# Patient Record
Sex: Male | Born: 1960 | Race: Black or African American | Hispanic: No | Smoking: Never smoker
Health system: Southern US, Community
[De-identification: ages and names within clinical notes are randomized; demographics above are authoritative.]

## PROBLEM LIST (undated history)

## (undated) ENCOUNTER — Emergency Department: Admission: EM | Payer: 59

## (undated) DIAGNOSIS — I10 Essential (primary) hypertension: Secondary | ICD-10-CM

## (undated) DIAGNOSIS — J45909 Unspecified asthma, uncomplicated: Secondary | ICD-10-CM

## (undated) DIAGNOSIS — E669 Obesity, unspecified: Secondary | ICD-10-CM

## (undated) DIAGNOSIS — E785 Hyperlipidemia, unspecified: Secondary | ICD-10-CM

## (undated) DIAGNOSIS — M109 Gout, unspecified: Secondary | ICD-10-CM

## (undated) DIAGNOSIS — G4730 Sleep apnea, unspecified: Secondary | ICD-10-CM

## (undated) HISTORY — DX: Essential (primary) hypertension: I10

## (undated) HISTORY — DX: Unspecified asthma, uncomplicated: J45.909

## (undated) HISTORY — PX: APPENDECTOMY: SHX54

---

## 2000-07-06 ENCOUNTER — Other Ambulatory Visit: Admission: RE | Admit: 2000-07-06 | Discharge: 2000-07-06 | Payer: Self-pay | Admitting: Otolaryngology

## 2002-06-16 ENCOUNTER — Ambulatory Visit (HOSPITAL_BASED_OUTPATIENT_CLINIC_OR_DEPARTMENT_OTHER): Admission: RE | Admit: 2002-06-16 | Discharge: 2002-06-16 | Payer: Self-pay | Admitting: Otolaryngology

## 2002-06-16 ENCOUNTER — Ambulatory Visit: Admission: RE | Admit: 2002-06-16 | Discharge: 2002-07-17 | Payer: Self-pay | Admitting: Radiation Oncology

## 2003-10-27 ENCOUNTER — Ambulatory Visit: Admission: RE | Admit: 2003-10-27 | Discharge: 2003-11-13 | Payer: Self-pay | Admitting: Radiation Oncology

## 2009-08-20 ENCOUNTER — Ambulatory Visit: Payer: Self-pay | Admitting: Gastroenterology

## 2010-06-12 ENCOUNTER — Ambulatory Visit: Payer: Self-pay | Admitting: Internal Medicine

## 2010-07-08 ENCOUNTER — Ambulatory Visit: Payer: Self-pay | Admitting: Internal Medicine

## 2012-10-04 ENCOUNTER — Other Ambulatory Visit: Payer: Self-pay | Admitting: Podiatry

## 2012-10-04 LAB — BODY FLUID CELL COUNT WITH DIFFERENTIAL
Basophil: 0 %
Eosinophil: 0 %
Lymphocytes: 1 %
Neutrophils: 68 %
Nucleated Cell Count: 1824 /mm3
Other Cells BF: 0 %
Other Mononuclear Cells: 31 %

## 2012-10-04 LAB — SYNOVIAL FLUID, CRYSTAL: Crystals, Joint Fluid: NONE SEEN

## 2013-06-21 ENCOUNTER — Ambulatory Visit: Payer: Self-pay | Admitting: Internal Medicine

## 2014-12-24 ENCOUNTER — Encounter: Payer: Self-pay | Admitting: *Deleted

## 2014-12-25 ENCOUNTER — Encounter
Admission: RE | Disposition: A | Discharge status: Home / Self Care | Payer: Self-pay | Source: Ambulatory Visit | Attending: Gastroenterology

## 2014-12-25 ENCOUNTER — Encounter: Payer: Self-pay | Admitting: *Deleted

## 2014-12-25 ENCOUNTER — Ambulatory Visit
Admission: RE | Admit: 2014-12-25 | Discharge: 2014-12-25 | Disposition: A | Discharge status: Home / Self Care | Payer: 59 | Source: Ambulatory Visit | Attending: Gastroenterology | Admitting: Gastroenterology

## 2014-12-25 ENCOUNTER — Ambulatory Visit: Payer: 59 | Admitting: Anesthesiology

## 2014-12-25 DIAGNOSIS — E785 Hyperlipidemia, unspecified: Secondary | ICD-10-CM | POA: Diagnosis not present

## 2014-12-25 DIAGNOSIS — K5730 Diverticulosis of large intestine without perforation or abscess without bleeding: Secondary | ICD-10-CM | POA: Diagnosis not present

## 2014-12-25 DIAGNOSIS — Z6830 Body mass index (BMI) 30.0-30.9, adult: Secondary | ICD-10-CM | POA: Diagnosis not present

## 2014-12-25 DIAGNOSIS — D124 Benign neoplasm of descending colon: Secondary | ICD-10-CM | POA: Insufficient documentation

## 2014-12-25 DIAGNOSIS — G4730 Sleep apnea, unspecified: Secondary | ICD-10-CM | POA: Diagnosis not present

## 2014-12-25 DIAGNOSIS — E669 Obesity, unspecified: Secondary | ICD-10-CM | POA: Diagnosis not present

## 2014-12-25 DIAGNOSIS — M109 Gout, unspecified: Secondary | ICD-10-CM | POA: Insufficient documentation

## 2014-12-25 DIAGNOSIS — Z86010 Personal history of colonic polyps: Secondary | ICD-10-CM | POA: Diagnosis present

## 2014-12-25 HISTORY — DX: Hyperlipidemia, unspecified: E78.5

## 2014-12-25 HISTORY — DX: Sleep apnea, unspecified: G47.30

## 2014-12-25 HISTORY — DX: Gout, unspecified: M10.9

## 2014-12-25 HISTORY — DX: Obesity, unspecified: E66.9

## 2014-12-25 HISTORY — PX: COLONOSCOPY WITH PROPOFOL: SHX5780

## 2014-12-25 SURGERY — COLONOSCOPY WITH PROPOFOL
Anesthesia: General

## 2014-12-25 MED ORDER — SODIUM CHLORIDE 0.9 % IV SOLN
INTRAVENOUS | Status: DC
  Administered 2014-12-25: 15:00:00 via INTRAVENOUS

## 2014-12-25 MED ORDER — LIDOCAINE HCL (CARDIAC) 20 MG/ML IV SOLN
INTRAVENOUS | Status: DC | PRN
  Administered 2014-12-25: 60 mg via INTRAVENOUS

## 2014-12-25 MED ORDER — PROPOFOL 10 MG/ML IV BOLUS
INTRAVENOUS | Status: DC | PRN
  Administered 2014-12-25: 40 mg via INTRAVENOUS

## 2014-12-25 MED ORDER — PROPOFOL INFUSION 10 MG/ML OPTIME
INTRAVENOUS | Status: DC | PRN
  Administered 2014-12-25: 120 ug/kg/min via INTRAVENOUS

## 2014-12-25 MED ORDER — MIDAZOLAM HCL 2 MG/2ML IJ SOLN
INTRAMUSCULAR | Status: DC | PRN
  Administered 2014-12-25 (×2): 1 mg via INTRAVENOUS

## 2014-12-25 NOTE — Anesthesia Preprocedure Evaluation (Signed)
Anesthesia Evaluation  Patient identified by MRN, date of birth, ID band Patient awake    Reviewed: Allergy & Precautions, NPO status , Patient's Chart, lab work & pertinent test results, reviewed documented beta blocker date and time   Airway Mallampati: III  TM Distance: >3 FB     Dental  (+) Chipped   Pulmonary sleep apnea ,          Cardiovascular     Neuro/Psych    GI/Hepatic   Endo/Other    Renal/GU      Musculoskeletal   Abdominal   Peds  Hematology   Anesthesia Other Findings Does not use CPAP. He will need to be watched tonight. Advised him to sleep sitting up if possible.  Reproductive/Obstetrics                             Anesthesia Physical Anesthesia Plan  ASA: III  Anesthesia Plan: General   Post-op Pain Management:    Induction: Intravenous  Airway Management Planned: Nasal Cannula  Additional Equipment:   Intra-op Plan:   Post-operative Plan:   Informed Consent: I have reviewed the patients History and Physical, chart, labs and discussed the procedure including the risks, benefits and alternatives for the proposed anesthesia with the patient or authorized representative who has indicated his/her understanding and acceptance.     Plan Discussed with: CRNA  Anesthesia Plan Comments:         Anesthesia Quick Evaluation

## 2014-12-25 NOTE — Anesthesia Postprocedure Evaluation (Signed)
  Anesthesia Post-op Note  Patient: Brandon Nichols  Procedure(s) Performed: Procedure(s): COLONOSCOPY WITH PROPOFOL (N/A)  Anesthesia type:General  Patient location: PACU  Post pain: Pain level controlled  Post assessment: Post-op Vital signs reviewed, Patient's Cardiovascular Status Stable, Respiratory Function Stable, Patent Airway and No signs of Nausea or vomiting  Post vital signs: Reviewed and stable  Last Vitals:  Filed Vitals:   12/25/14 1710  BP: 114/85  Pulse: 54  Temp:   Resp: 12    Level of consciousness: awake, alert  and patient cooperative  Complications: No apparent anesthesia complications

## 2014-12-25 NOTE — Transfer of Care (Signed)
Immediate Anesthesia Transfer of Care Note  Patient: Brandon Nichols  Procedure(s) Performed: Procedure(s): COLONOSCOPY WITH PROPOFOL (N/A)  Patient Location: Endoscopy Unit  Anesthesia Type:General  Level of Consciousness: awake, alert , oriented and patient cooperative  Airway & Oxygen Therapy: Patient Spontanous Breathing and Patient connected to nasal cannula oxygen  Post-op Assessment: Report given to RN, Post -op Vital signs reviewed and stable and Patient moving all extremities X 4  Post vital signs: Reviewed and stable  Last Vitals:  Filed Vitals:   12/25/14 1640  BP: 94/62  Pulse: 63  Temp: 36.3 C  Resp: 10    Complications: No apparent anesthesia complications

## 2014-12-25 NOTE — Op Note (Signed)
[facility] Gastroenterology Patient Name: Brandon Nichols Procedure Date: 12/25/2014 3:59 PM MRN: [id number] Account #: [id number] Date of Birth: 26-May-1961 Admit Type: Outpatient Age: 54 Room: [facility] ENDO ROOM 1 Gender: Male Note Status: Finalized Procedure:         Colonoscopy Indications:       Personal history of colonic polyps Providers:         Lollie Sails, MD Referring MD:      Tracie Harrier, MD (Referring MD) Medicines:         Monitored Anesthesia Care Complications:     No immediate complications. Procedure:         Pre-Anesthesia Assessment:                    - ASA Grade Assessment: III - A patient with severe                     systemic disease.                    After obtaining informed consent, the colonoscope was                     passed under direct vision. Throughout the procedure, the                     patient's blood pressure, pulse, and oxygen saturations                     were monitored continuously. The Olympus PCF-H180AL                     colonoscope ( S#: [id number] ) was introduced through the                     anus and advanced to the the cecum, identified by                     appendiceal orifice and ileocecal valve. The colonoscopy                     was performed without difficulty. The patient tolerated                     the procedure well. The quality of the bowel preparation                     was fair. Findings:      A 2 mm polyp was found in the descending colon. The polyp was sessile.       The polyp was removed with a cold biopsy forceps. Resection and       retrieval were complete.      A few small-mouthed diverticula were found in the sigmoid colon.      The retroflexed view of the distal rectum and anal verge was normal and       showed no anal or rectal abnormalities.      The digital rectal exam was normal. Impression:        - One 2 mm polyp in the descending colon. Resected and     retrieved.                    - Diverticulosis in the sigmoid colon.                    -  The distal rectum and anal verge are normal on                     retroflexion view. Recommendation:    - Await pathology results.                    - Telephone GI clinic for pathology results in 1 week. Procedure Code(s): --- Professional ---                    [id number], Colonoscopy, flexible; with biopsy, single or                     multiple Diagnosis Code(s): --- Professional ---                    211.3, Benign neoplasm of colon                    V12.72, Personal history of colonic polyps                    562.10, Diverticulosis of colon (without mention of                     hemorrhage) CPT copyright 2014 American Medical Association. All rights reserved. The codes documented in this report are preliminary and upon coder review may  be revised to meet current compliance requirements. Lollie Sails, MD 12/25/2014 4:39:15 PM This report has been signed electronically. Number of Addenda: 0 Note Initiated On: 12/25/2014 3:59 PM Scope Withdrawal Time: 0 hours 10 minutes 59 seconds  Total Procedure Duration: 0 hours 19 minutes 48 seconds       [facility]

## 2014-12-25 NOTE — H&P (Signed)
Outpatient short stay form Pre-procedure 12/25/2014 3:59 PM Lollie Sails MD  Primary Physician: Dr. Roetta Sessions  Reason for visit:  Colonoscopy  History of present illness:  Personal history of adenomatous colon polyps. Patient is a 54 year old male seen for follow-up in regards to his history of colon polyps. Colonoscopy was in 2011. There were several polyps removed at that time consistent with adenomas.  He is not taking anticoagulates or aspirin products. He tolerated his prep well.    Current facility-administered medications:  .  0.9 %  sodium chloride infusion, , Intravenous, Continuous, Lollie Sails, MD, Last Rate: 20 mL/hr at 12/25/14 1513  No prescriptions prior to admission     Not on File   Past Medical History  Diagnosis Date  . Hyperlipemia   . Obesity   . Sleep apnea   . Gout     Review of systems:      Physical Exam    Heart and lungs: Regular rate and rhythm without rub or gallop lungs bilaterally clear    HEENT: Normocephalic atraumatic eyes are anicteric    Other:     Pertinant exam for procedure: Soft nontender nondistended bowel sounds positive normoactive    Planned proceedures: Colonoscopy and indicated procedures I have discussed the risks benefits and complications of procedures to include not limited to bleeding, infection, perforation and the risk of sedation and the patient wishes to proceed.    Lollie Sails, MD Gastroenterology 12/25/2014  3:59 PM

## 2014-12-26 NOTE — Progress Notes (Signed)
Non-identifying Voicemail.  No message left. 

## 2014-12-29 ENCOUNTER — Encounter: Payer: Self-pay | Admitting: Gastroenterology

## 2014-12-30 LAB — SURGICAL PATHOLOGY

## 2015-06-15 ENCOUNTER — Other Ambulatory Visit: Payer: Self-pay | Admitting: Internal Medicine

## 2015-06-15 ENCOUNTER — Ambulatory Visit
Admission: RE | Admit: 2015-06-15 | Discharge: 2015-06-15 | Disposition: A | Discharge status: Home / Self Care | Payer: 59 | Source: Ambulatory Visit | Attending: Internal Medicine | Admitting: Internal Medicine

## 2015-06-15 DIAGNOSIS — R1032 Left lower quadrant pain: Secondary | ICD-10-CM

## 2015-06-15 DIAGNOSIS — R634 Abnormal weight loss: Secondary | ICD-10-CM

## 2015-06-15 DIAGNOSIS — I700 Atherosclerosis of aorta: Secondary | ICD-10-CM | POA: Diagnosis not present

## 2015-06-15 MED ORDER — IOHEXOL 300 MG/ML  SOLN
100.0000 mL | Freq: Once | INTRAMUSCULAR | Status: AC | PRN
  Administered 2015-06-15: 100 mL via INTRAVENOUS

## 2016-07-10 DIAGNOSIS — L905 Scar conditions and fibrosis of skin: Secondary | ICD-10-CM | POA: Diagnosis not present

## 2016-07-10 DIAGNOSIS — Z0000 Encounter for general adult medical examination without abnormal findings: Secondary | ICD-10-CM | POA: Diagnosis not present

## 2016-07-10 DIAGNOSIS — Z8619 Personal history of other infectious and parasitic diseases: Secondary | ICD-10-CM | POA: Diagnosis not present

## 2016-10-13 DIAGNOSIS — M109 Gout, unspecified: Secondary | ICD-10-CM | POA: Diagnosis not present

## 2016-11-01 DIAGNOSIS — L819 Disorder of pigmentation, unspecified: Secondary | ICD-10-CM | POA: Diagnosis not present

## 2016-11-01 DIAGNOSIS — L905 Scar conditions and fibrosis of skin: Secondary | ICD-10-CM | POA: Diagnosis not present

## 2016-11-01 DIAGNOSIS — Z872 Personal history of diseases of the skin and subcutaneous tissue: Secondary | ICD-10-CM | POA: Diagnosis not present

## 2017-01-01 DIAGNOSIS — L905 Scar conditions and fibrosis of skin: Secondary | ICD-10-CM | POA: Diagnosis not present

## 2017-01-01 DIAGNOSIS — Z0000 Encounter for general adult medical examination without abnormal findings: Secondary | ICD-10-CM | POA: Diagnosis not present

## 2017-01-01 DIAGNOSIS — Z8619 Personal history of other infectious and parasitic diseases: Secondary | ICD-10-CM | POA: Diagnosis not present

## 2017-01-08 DIAGNOSIS — L989 Disorder of the skin and subcutaneous tissue, unspecified: Secondary | ICD-10-CM | POA: Diagnosis not present

## 2017-01-08 DIAGNOSIS — J302 Other seasonal allergic rhinitis: Secondary | ICD-10-CM | POA: Diagnosis not present

## 2017-01-08 DIAGNOSIS — E782 Mixed hyperlipidemia: Secondary | ICD-10-CM | POA: Diagnosis not present

## 2017-02-22 DIAGNOSIS — J302 Other seasonal allergic rhinitis: Secondary | ICD-10-CM | POA: Diagnosis not present

## 2017-02-22 DIAGNOSIS — R51 Headache: Secondary | ICD-10-CM | POA: Diagnosis not present

## 2017-02-23 ENCOUNTER — Other Ambulatory Visit: Payer: Self-pay | Admitting: Internal Medicine

## 2017-02-23 DIAGNOSIS — R519 Headache, unspecified: Secondary | ICD-10-CM

## 2017-02-23 DIAGNOSIS — R51 Headache: Principal | ICD-10-CM

## 2017-03-02 ENCOUNTER — Other Ambulatory Visit: Payer: Self-pay | Admitting: Internal Medicine

## 2017-03-02 ENCOUNTER — Ambulatory Visit
Admission: RE | Admit: 2017-03-02 | Discharge: 2017-03-02 | Disposition: A | Discharge status: Home / Self Care | Payer: 59 | Source: Ambulatory Visit | Attending: Internal Medicine | Admitting: Internal Medicine

## 2017-03-02 DIAGNOSIS — R519 Headache, unspecified: Secondary | ICD-10-CM

## 2017-03-02 DIAGNOSIS — R51 Headache: Secondary | ICD-10-CM | POA: Diagnosis not present

## 2017-03-02 MED ORDER — GADOBENATE DIMEGLUMINE 529 MG/ML IV SOLN: 20.0000 mL | Freq: Once | INTRAVENOUS | Status: DC | PRN

## 2017-03-07 DIAGNOSIS — L819 Disorder of pigmentation, unspecified: Secondary | ICD-10-CM | POA: Diagnosis not present

## 2017-03-07 DIAGNOSIS — L910 Hypertrophic scar: Secondary | ICD-10-CM | POA: Diagnosis not present

## 2017-03-09 DIAGNOSIS — M1000 Idiopathic gout, unspecified site: Secondary | ICD-10-CM | POA: Diagnosis not present

## 2017-04-13 DIAGNOSIS — G4733 Obstructive sleep apnea (adult) (pediatric): Secondary | ICD-10-CM | POA: Diagnosis not present

## 2017-04-13 DIAGNOSIS — M79672 Pain in left foot: Secondary | ICD-10-CM | POA: Diagnosis not present

## 2017-04-13 DIAGNOSIS — E782 Mixed hyperlipidemia: Secondary | ICD-10-CM | POA: Diagnosis not present

## 2017-04-25 DIAGNOSIS — L910 Hypertrophic scar: Secondary | ICD-10-CM | POA: Diagnosis not present

## 2017-06-20 DIAGNOSIS — L910 Hypertrophic scar: Secondary | ICD-10-CM | POA: Diagnosis not present

## 2017-07-27 DIAGNOSIS — E782 Mixed hyperlipidemia: Secondary | ICD-10-CM | POA: Diagnosis not present

## 2017-07-27 DIAGNOSIS — M79672 Pain in left foot: Secondary | ICD-10-CM | POA: Diagnosis not present

## 2017-07-27 DIAGNOSIS — Z8739 Personal history of other diseases of the musculoskeletal system and connective tissue: Secondary | ICD-10-CM | POA: Diagnosis not present

## 2017-07-27 DIAGNOSIS — L989 Disorder of the skin and subcutaneous tissue, unspecified: Secondary | ICD-10-CM | POA: Diagnosis not present

## 2017-07-27 DIAGNOSIS — J302 Other seasonal allergic rhinitis: Secondary | ICD-10-CM | POA: Diagnosis not present

## 2017-08-17 DIAGNOSIS — Z0000 Encounter for general adult medical examination without abnormal findings: Secondary | ICD-10-CM | POA: Diagnosis not present

## 2017-08-17 DIAGNOSIS — Z8739 Personal history of other diseases of the musculoskeletal system and connective tissue: Secondary | ICD-10-CM | POA: Diagnosis not present

## 2017-08-17 DIAGNOSIS — K581 Irritable bowel syndrome with constipation: Secondary | ICD-10-CM | POA: Diagnosis not present

## 2017-08-22 DIAGNOSIS — L720 Epidermal cyst: Secondary | ICD-10-CM | POA: Diagnosis not present

## 2017-08-22 DIAGNOSIS — L910 Hypertrophic scar: Secondary | ICD-10-CM | POA: Diagnosis not present

## 2017-10-03 DIAGNOSIS — L910 Hypertrophic scar: Secondary | ICD-10-CM | POA: Diagnosis not present

## 2018-02-11 DIAGNOSIS — Z8739 Personal history of other diseases of the musculoskeletal system and connective tissue: Secondary | ICD-10-CM | POA: Diagnosis not present

## 2018-02-11 DIAGNOSIS — J302 Other seasonal allergic rhinitis: Secondary | ICD-10-CM | POA: Diagnosis not present

## 2018-02-11 DIAGNOSIS — K581 Irritable bowel syndrome with constipation: Secondary | ICD-10-CM | POA: Diagnosis not present

## 2018-02-19 DIAGNOSIS — R030 Elevated blood-pressure reading, without diagnosis of hypertension: Secondary | ICD-10-CM | POA: Diagnosis not present

## 2018-02-19 DIAGNOSIS — R0789 Other chest pain: Secondary | ICD-10-CM | POA: Diagnosis not present

## 2018-02-19 DIAGNOSIS — E785 Hyperlipidemia, unspecified: Secondary | ICD-10-CM | POA: Diagnosis not present

## 2018-03-11 DIAGNOSIS — E785 Hyperlipidemia, unspecified: Secondary | ICD-10-CM | POA: Diagnosis not present

## 2018-03-11 DIAGNOSIS — G4733 Obstructive sleep apnea (adult) (pediatric): Secondary | ICD-10-CM | POA: Diagnosis not present

## 2018-03-11 DIAGNOSIS — R079 Chest pain, unspecified: Secondary | ICD-10-CM | POA: Diagnosis not present

## 2018-04-03 DIAGNOSIS — G4733 Obstructive sleep apnea (adult) (pediatric): Secondary | ICD-10-CM | POA: Diagnosis not present

## 2018-04-03 DIAGNOSIS — E785 Hyperlipidemia, unspecified: Secondary | ICD-10-CM | POA: Diagnosis not present

## 2018-04-03 DIAGNOSIS — R079 Chest pain, unspecified: Secondary | ICD-10-CM | POA: Diagnosis not present

## 2018-04-11 DIAGNOSIS — R0789 Other chest pain: Secondary | ICD-10-CM | POA: Diagnosis not present

## 2018-04-11 DIAGNOSIS — G4733 Obstructive sleep apnea (adult) (pediatric): Secondary | ICD-10-CM | POA: Diagnosis not present

## 2018-04-11 DIAGNOSIS — E782 Mixed hyperlipidemia: Secondary | ICD-10-CM | POA: Diagnosis not present

## 2018-04-17 DIAGNOSIS — Z23 Encounter for immunization: Secondary | ICD-10-CM | POA: Diagnosis not present

## 2018-08-21 DIAGNOSIS — E785 Hyperlipidemia, unspecified: Secondary | ICD-10-CM | POA: Diagnosis not present

## 2018-08-21 DIAGNOSIS — Z125 Encounter for screening for malignant neoplasm of prostate: Secondary | ICD-10-CM | POA: Diagnosis not present

## 2018-08-21 DIAGNOSIS — R030 Elevated blood-pressure reading, without diagnosis of hypertension: Secondary | ICD-10-CM | POA: Diagnosis not present

## 2018-08-21 DIAGNOSIS — R0789 Other chest pain: Secondary | ICD-10-CM | POA: Diagnosis not present

## 2018-08-28 DIAGNOSIS — E7800 Pure hypercholesterolemia, unspecified: Secondary | ICD-10-CM | POA: Diagnosis not present

## 2018-08-28 DIAGNOSIS — Z8739 Personal history of other diseases of the musculoskeletal system and connective tissue: Secondary | ICD-10-CM | POA: Diagnosis not present

## 2018-08-28 DIAGNOSIS — Z0000 Encounter for general adult medical examination without abnormal findings: Secondary | ICD-10-CM | POA: Diagnosis not present

## 2018-08-28 DIAGNOSIS — Z23 Encounter for immunization: Secondary | ICD-10-CM | POA: Diagnosis not present

## 2018-12-29 IMAGING — MR MR HEAD W/O CM
10 series · 48 of 48 positions shown · IV contrast (20mL Multihance)
Comparison: None.

CLINICAL DATA: Left frontal headache with left eye vision changes
for 1 week.

EXAM:
MRI HEAD WITHOUT CONTRAST
TECHNIQUE: Multiplanar, multiecho pulse sequences of the brain and surrounding
structures were obtained without intravenous contrast.

[Series 2: T1 · sagittal · 5.0mm · 0.45mm/px · 2 of 27 slices shown (1 of 2)]
[im 1/27]
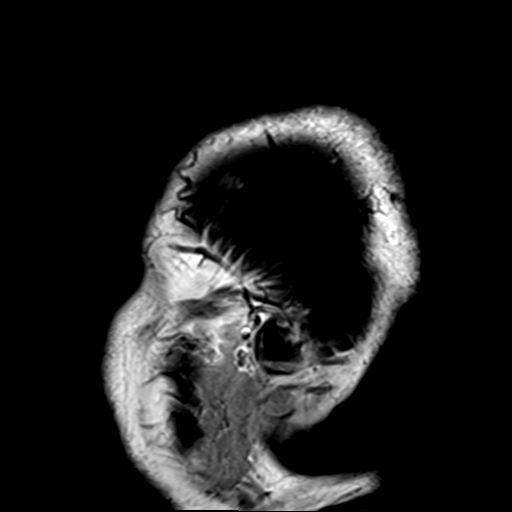
[im 27/27]
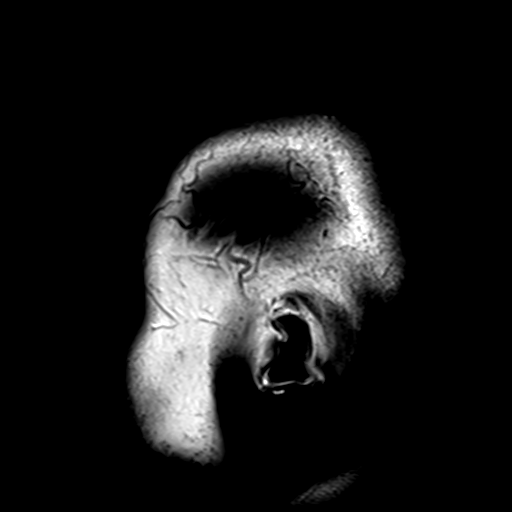

[Series 4: DWI · axial · 3.0mm · 1.80mm/px · z∈[-81,+81]mm · 5 of 55 slices shown (1 of 4)]
[im 1/55]
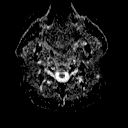
[im 14/55]
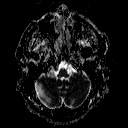
[im 28/55]
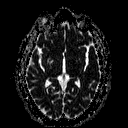
[im 41/55]
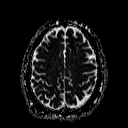
[im 55/55]
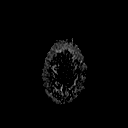

[Series 6: DWI · coronal · 3.0mm · 1.80mm/px · 4 of 45 slices shown (2 of 4)]
[im 1/45]
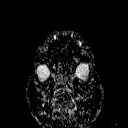
[im 15/45]
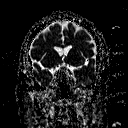
[im 30/45]
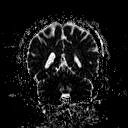
[im 45/45]
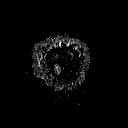

[Series 7: T2 · axial · 5.0mm · 0.60mm/px · z∈[-75,+80]mm · 2 of 25 slices shown (1 of 3)]
[im 1/25]
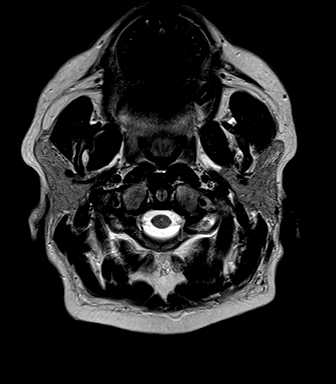
[im 25/25]
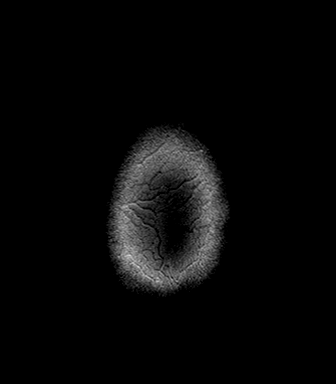

[Series 8: FLAIR · axial · 3.0mm · 0.45mm/px · z∈[-75,+80]mm · 5 of 53 slices shown]
[im 1/53]
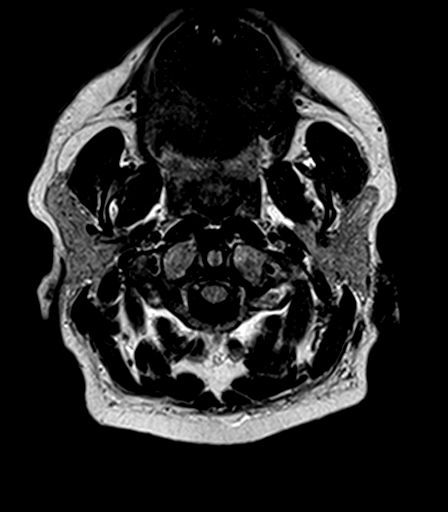
[im 14/53]
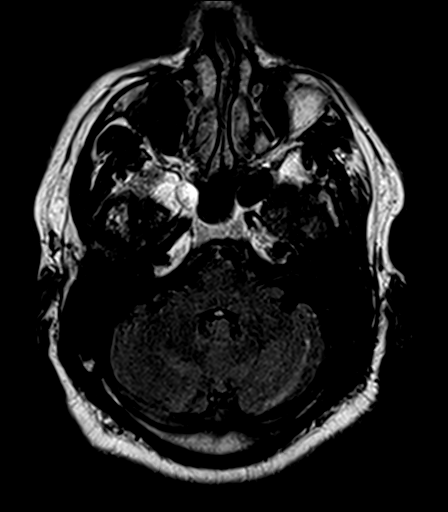
[im 27/53]
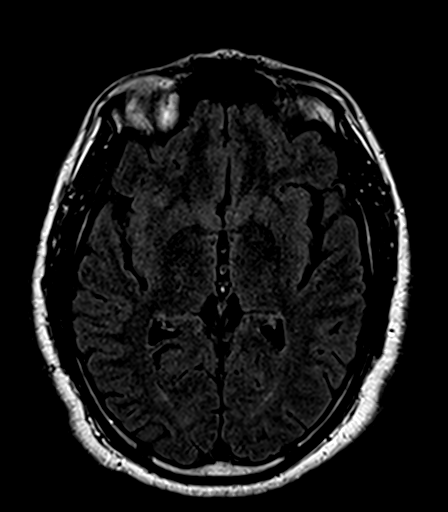
[im 40/53]
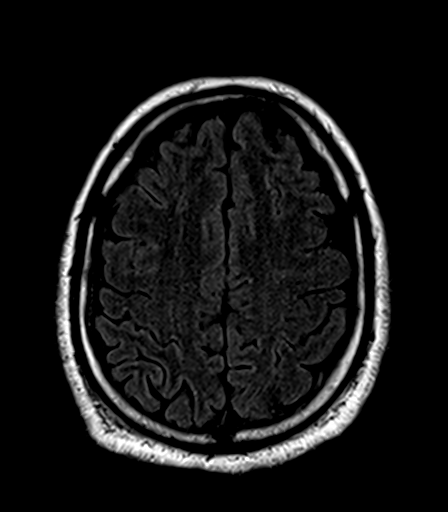
[im 53/53]
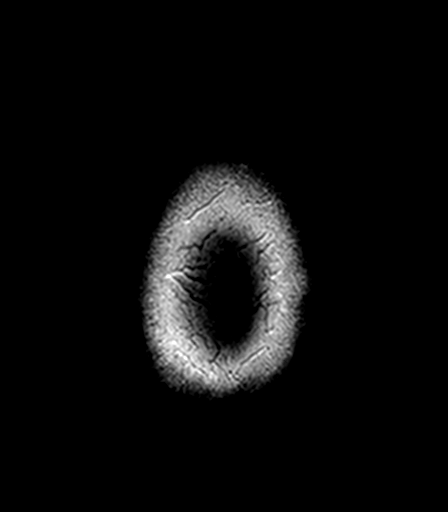

[Series 9: T2 · axial · 5.0mm · 0.45mm/px · z∈[-75,+80]mm · 2 of 25 slices shown (2 of 3)]
[im 1/25]
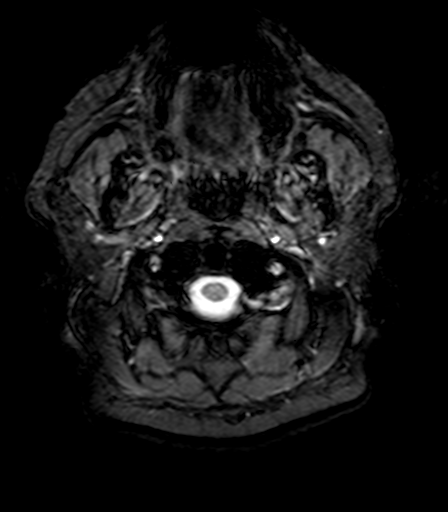
[im 25/25]
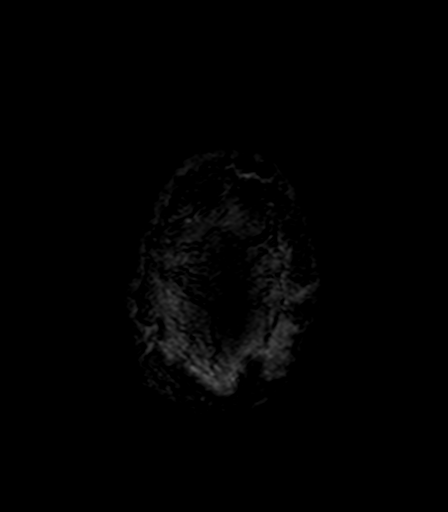

[Series 10: T1 · axial · 1.0mm · 1.00mm/px · z∈[-85,+90]mm · 16 of 176 slices shown (2 of 2)]
[im 1/176]
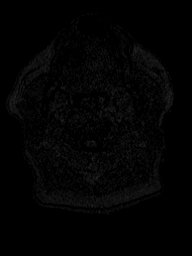
[im 12/176]
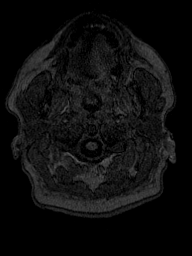
[im 24/176]
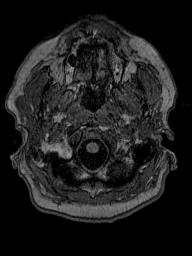
[im 36/176]
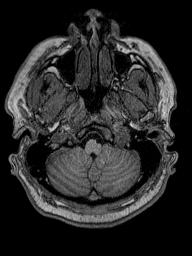
[im 47/176]
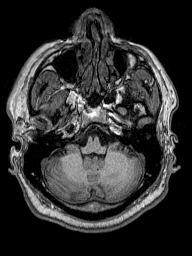
[im 59/176]
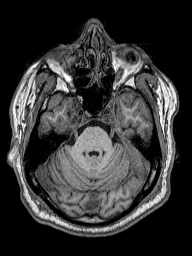
[im 71/176]
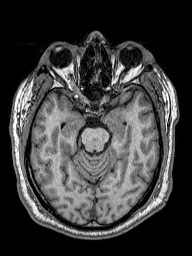
[im 82/176]
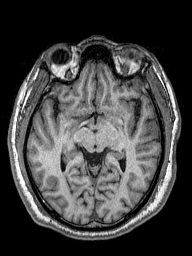
[im 94/176]
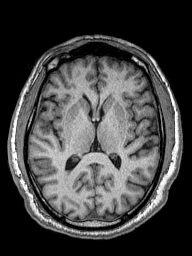
[im 106/176]
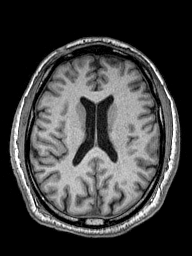
[im 117/176]
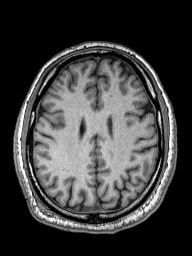
[im 129/176]
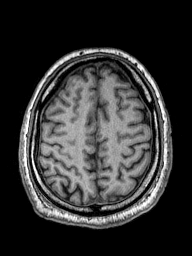
[im 141/176]
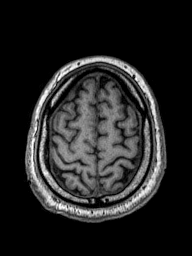
[im 152/176]
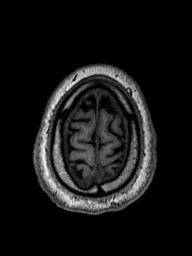
[im 164/176]
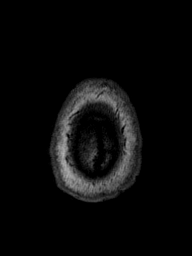
[im 176/176]
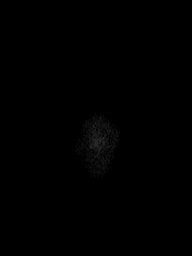

[Series 11: T2 · coronal · 5.0mm · 0.49mm/px · 3 of 29 slices shown (3 of 3)]
[im 1/29]
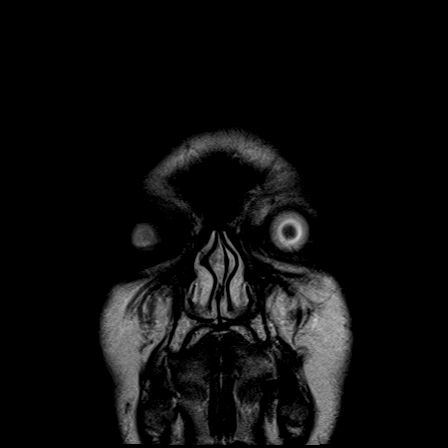
[im 15/29]
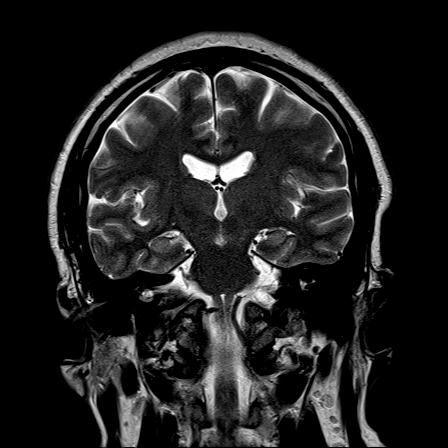
[im 29/29]
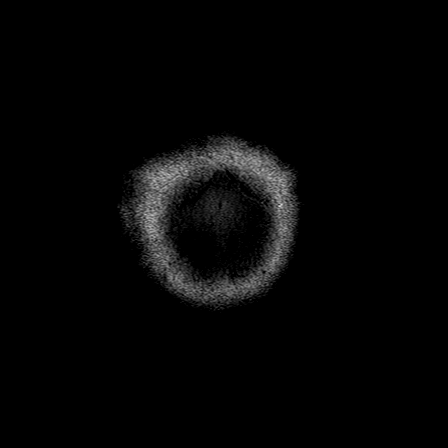

[Series 100: DWI · axial · 3.0mm · 1.80mm/px · z∈[-81,+81]mm · 5 of 55 slices shown (3 of 4)]
[im 1/55]
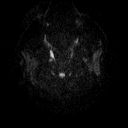
[im 14/55]
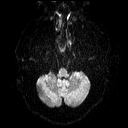
[im 28/55]
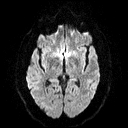
[im 41/55]
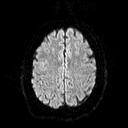
[im 55/55]
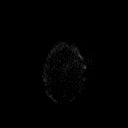

[Series 101: DWI · coronal · 3.0mm · 1.80mm/px · 4 of 45 slices shown (4 of 4)]
[im 1/45]
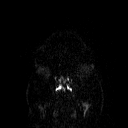
[im 15/45]
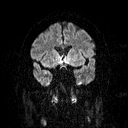
[im 30/45]
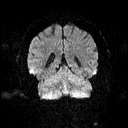
[im 45/45]
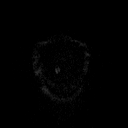

[48 of 48 positions shown; findings below may reference images not displayed]

FINDINGS: IV access could not be obtained and no contrast was administered.

Brain: There is no evidence of acute infarct, intracranial
hemorrhage, mass, midline shift, or extra-axial fluid collection.
The ventricles and sulci are normal. The brain is normal in signal.

Vascular: Major intracranial vascular flow voids are preserved.

Skull and upper cervical spine: Unremarkable bone marrow signal.

Sinuses/Orbits: Unremarkable orbits. Paranasal sinuses and mastoid
air cells are clear.

Other: None.
IMPRESSION: Unremarkable brain MRI.

## 2019-03-10 ENCOUNTER — Other Ambulatory Visit: Payer: Self-pay

## 2019-03-10 DIAGNOSIS — Z20822 Contact with and (suspected) exposure to covid-19: Secondary | ICD-10-CM

## 2019-03-11 LAB — NOVEL CORONAVIRUS, NAA: SARS-CoV-2, NAA: NOT DETECTED

## 2019-03-12 ENCOUNTER — Telehealth: Payer: Self-pay | Admitting: General Practice

## 2019-03-12 NOTE — Telephone Encounter (Signed)
Gave patient negative covid test results Patient understood 

## 2019-09-05 ENCOUNTER — Ambulatory Visit: Payer: 59 | Attending: Internal Medicine

## 2019-09-05 DIAGNOSIS — Z20822 Contact with and (suspected) exposure to covid-19: Secondary | ICD-10-CM

## 2019-09-06 LAB — NOVEL CORONAVIRUS, NAA: SARS-CoV-2, NAA: DETECTED — AB

## 2019-09-10 ENCOUNTER — Other Ambulatory Visit: Payer: Self-pay | Admitting: Physician Assistant

## 2019-09-10 ENCOUNTER — Encounter: Payer: Self-pay | Admitting: Physician Assistant

## 2019-09-10 DIAGNOSIS — I10 Essential (primary) hypertension: Secondary | ICD-10-CM

## 2019-09-10 DIAGNOSIS — U071 COVID-19: Secondary | ICD-10-CM

## 2019-09-10 MED ORDER — SODIUM CHLORIDE 0.9 % IV SOLN
700.0000 mg | Freq: Once | INTRAVENOUS | Status: AC
  Administered 2019-09-11: 700 mg via INTRAVENOUS
  Filled 2019-09-10: qty 700

## 2019-09-10 NOTE — Progress Notes (Signed)
  I connected by phone with Brandon Nichols on 09/10/2019 at 11:14 AM to discuss the potential use of an new treatment for mild to moderate COVID-19 viral infection in non-hospitalized patients.  This patient is a 59 y.o. male that meets the FDA criteria for Emergency Use Authorization of bamlanivimab or casirivimab\imdevimab.  Has a (+) direct SARS-CoV-2 viral test result  Has mild or moderate COVID-19   Is ? 61 years of age and weighs ? 40 kg  Is NOT hospitalized due to COVID-19  Is NOT requiring oxygen therapy or requiring an increase in baseline oxygen flow rate due to COVID-19  Is within 10 days of symptom onset  Has at least one of the high risk factor(s) for progression to severe COVID-19 and/or hospitalization as defined in EUA.  Specific high risk criteria : Hypertension   I have spoken and communicated the following to the patient or parent/caregiver:  1. FDA has authorized the emergency use of bamlanivimab and casirivimab\imdevimab for the treatment of mild to moderate COVID-19 in adults and pediatric patients with positive results of direct SARS-CoV-2 viral testing who are 74 years of age and older weighing at least 40 kg, and who are at high risk for progressing to severe COVID-19 and/or hospitalization.  2. The significant known and potential risks and benefits of bamlanivimab and casirivimab\imdevimab, and the extent to which such potential risks and benefits are unknown.  3. Information on available alternative treatments and the risks and benefits of those alternatives, including clinical trials.  4. Patients treated with bamlanivimab and casirivimab\imdevimab should continue to self-isolate and use infection control measures (e.g., wear mask, isolate, social distance, avoid sharing personal items, clean and disinfect "high touch" surfaces, and frequent handwashing) according to CDC guidelines.   5. The patient or parent/caregiver has the option to accept or refuse  bamlanivimab or casirivimab\imdevimab .  After reviewing this information with the patient, The patient agreed to proceed with receiving the bamlanimivab infusion and will be provided a copy of the Fact sheet prior to receiving the infusion.[name] 09/10/2019 11:14 AM

## 2019-09-11 ENCOUNTER — Encounter (HOSPITAL_COMMUNITY): Payer: Self-pay

## 2019-09-11 ENCOUNTER — Ambulatory Visit (HOSPITAL_COMMUNITY)
Admission: RE | Admit: 2019-09-11 | Discharge: 2019-09-11 | Disposition: A | Discharge status: Home / Self Care | Payer: 59 | Source: Ambulatory Visit | Attending: Pulmonary Disease | Admitting: Pulmonary Disease

## 2019-09-11 DIAGNOSIS — U071 COVID-19: Secondary | ICD-10-CM | POA: Insufficient documentation

## 2019-09-11 MED ORDER — METHYLPREDNISOLONE SODIUM SUCC 125 MG IJ SOLR: 125.0000 mg | Freq: Once | INTRAMUSCULAR | Status: DC | PRN

## 2019-09-11 MED ORDER — EPINEPHRINE 0.3 MG/0.3ML IJ SOAJ: 0.3000 mg | Freq: Once | INTRAMUSCULAR | Status: DC | PRN

## 2019-09-11 MED ORDER — FAMOTIDINE IN NACL 20-0.9 MG/50ML-% IV SOLN: 20.0000 mg | Freq: Once | INTRAVENOUS | Status: DC | PRN

## 2019-09-11 MED ORDER — DIPHENHYDRAMINE HCL 50 MG/ML IJ SOLN: 50.0000 mg | Freq: Once | INTRAMUSCULAR | Status: DC | PRN

## 2019-09-11 MED ORDER — ALBUTEROL SULFATE HFA 108 (90 BASE) MCG/ACT IN AERS: 2.0000 | INHALATION_SPRAY | Freq: Once | RESPIRATORY_TRACT | Status: DC | PRN

## 2019-09-11 MED ORDER — SODIUM CHLORIDE 0.9 % IV SOLN: INTRAVENOUS | Status: DC | PRN

## 2019-09-11 NOTE — Discharge Instructions (Signed)
10 Things You Can Do to Manage Your COVID-19 Symptoms at Home If you have possible or confirmed COVID-19: 1. Stay home from work and school. And stay away from other public places. If you must go out, avoid using any kind of public transportation, ridesharing, or taxis. 2. Monitor your symptoms carefully. If your symptoms get worse, call your healthcare provider immediately. 3. Get rest and stay hydrated. 4. If you have a medical appointment, call the healthcare provider ahead of time and tell them that you have or may have COVID-19. 5. For medical emergencies, call 911 and notify the dispatch personnel that you have or may have COVID-19. 6. Cover your cough and sneezes with a tissue or use the inside of your elbow. 7. Wash your hands often with soap and water for at least 20 seconds or clean your hands with an alcohol-based hand sanitizer that contains at least 60% alcohol. 8. As much as possible, stay in a specific room and away from other people in your home. Also, you should use a separate bathroom, if available. If you need to be around other people in or outside of the home, wear a mask. 9. Avoid sharing personal items with other people in your household, like dishes, towels, and bedding. 10. Clean all surfaces that are touched often, like counters, tabletops, and doorknobs. Use household cleaning sprays or wipes according to the label instructions. [email or web address] What types of side effects do monoclonal antibody drugs cause?  Common side effects  In general, the more common side effects caused by monoclonal antibody drugs include: . Allergic reactions, such as hives or itching . Flu-like signs and symptoms, including chills, fatigue, fever, and muscle aches and pains . Nausea, vomiting . Diarrhea . Skin rashes . Low blood pressure   The CDC is recommending patients who receive monoclonal antibody treatments wait at least 90 days before being vaccinated.  Currently, there are no  data on the safety and efficacy of mRNA COVID-19 vaccines in persons who received monoclonal antibodies or convalescent plasma as part of COVID-19 treatment. Based on the estimated half-life of such therapies as well as evidence suggesting that reinfection is uncommon in the 90 days after initial infection, vaccination should be deferred for at least 90 days, as a precautionary measure until additional information becomes available, to avoid interference of the antibody treatment with vaccine-induced immune responses.

## 2019-09-11 NOTE — Progress Notes (Signed)
  Diagnosis: COVID-19  Physician: Dr.wright  Procedure: Covid Infusion Clinic Med: bamlanivimab infusion - Provided patient with bamlanimivab fact sheet for patients, parents and caregivers prior to infusion.  Complications: No immediate complications noted.  Discharge: Discharged home   Brandon Nichols 09/11/2019

## 2020-09-21 ENCOUNTER — Other Ambulatory Visit: Payer: Self-pay | Admitting: Internal Medicine

## 2020-09-21 DIAGNOSIS — R220 Localized swelling, mass and lump, head: Secondary | ICD-10-CM

## 2020-09-21 DIAGNOSIS — R519 Headache, unspecified: Secondary | ICD-10-CM

## 2020-10-06 ENCOUNTER — Other Ambulatory Visit: Payer: Self-pay

## 2020-10-06 ENCOUNTER — Ambulatory Visit
Admission: RE | Admit: 2020-10-06 | Discharge: 2020-10-06 | Disposition: A | Discharge status: Home / Self Care | Payer: 59 | Source: Ambulatory Visit | Attending: Internal Medicine | Admitting: Internal Medicine

## 2020-10-06 DIAGNOSIS — R519 Headache, unspecified: Secondary | ICD-10-CM | POA: Diagnosis not present

## 2020-10-06 DIAGNOSIS — G8929 Other chronic pain: Secondary | ICD-10-CM | POA: Diagnosis present

## 2020-10-06 DIAGNOSIS — R220 Localized swelling, mass and lump, head: Secondary | ICD-10-CM | POA: Diagnosis present

## 2020-10-23 ENCOUNTER — Emergency Department
Admission: EM | Admit: 2020-10-23 | Discharge: 2020-10-23 | Disposition: A | Discharge status: Home / Self Care | Payer: 59 | Attending: Emergency Medicine | Admitting: Emergency Medicine

## 2020-10-23 ENCOUNTER — Emergency Department: Payer: 59

## 2020-10-23 ENCOUNTER — Other Ambulatory Visit: Payer: Self-pay

## 2020-10-23 DIAGNOSIS — K2900 Acute gastritis without bleeding: Secondary | ICD-10-CM | POA: Insufficient documentation

## 2020-10-23 DIAGNOSIS — I10 Essential (primary) hypertension: Secondary | ICD-10-CM | POA: Diagnosis not present

## 2020-10-23 DIAGNOSIS — R1012 Left upper quadrant pain: Secondary | ICD-10-CM | POA: Diagnosis present

## 2020-10-23 DIAGNOSIS — R748 Abnormal levels of other serum enzymes: Secondary | ICD-10-CM | POA: Diagnosis not present

## 2020-10-23 DIAGNOSIS — J45909 Unspecified asthma, uncomplicated: Secondary | ICD-10-CM | POA: Diagnosis not present

## 2020-10-23 LAB — COMPREHENSIVE METABOLIC PANEL
ALT: 22 U/L (ref 0–44)
AST: 20 U/L (ref 15–41)
Albumin: 4.3 g/dL (ref 3.5–5.0)
Alkaline Phosphatase: 59 U/L (ref 38–126)
Anion gap: 10 (ref 5–15)
BUN: 12 mg/dL (ref 8–23)
CO2: 24 mmol/L (ref 22–32)
Calcium: 9.5 mg/dL (ref 8.9–10.3)
Chloride: 104 mmol/L (ref 98–111)
Creatinine, Ser: 0.91 mg/dL (ref 0.61–1.24)
GFR, Estimated: >60 mL/min (ref >60)
Glucose, Bld: 99 mg/dL (ref 70–99)
Potassium: 3.9 mmol/L (ref 3.5–5.1)
Sodium: 138 mmol/L (ref 135–145)
Total Bilirubin: 1 mg/dL (ref 0.3–1.2)
Total Protein: 8 g/dL (ref 6.5–8.1)

## 2020-10-23 LAB — CBC
HCT: 44.9 % (ref 39.0–52.0)
Hemoglobin: 15.4 g/dL (ref 13.0–17.0)
MCH: 31.2 pg (ref 26.0–34.0)
MCHC: 34.3 g/dL (ref 30.0–36.0)
MCV: 91.1 fL (ref 80.0–100.0)
Platelets: 206 10*3/uL (ref 150–400)
RBC: 4.93 MIL/uL (ref 4.22–5.81)
RDW: 12 % (ref 11.5–15.5)
WBC: 5.1 10*3/uL (ref 4.0–10.5)
nRBC: 0 % (ref 0.0–0.2)

## 2020-10-23 LAB — TROPONIN I (HIGH SENSITIVITY): Troponin I (High Sensitivity): <2 ng/L (ref <18)

## 2020-10-23 LAB — LIPASE, BLOOD: Lipase: 62 U/L — ABNORMAL HIGH (ref 11–51)

## 2020-10-23 MED ORDER — LIDOCAINE VISCOUS HCL 2 % MT SOLN
15.0000 mL | Freq: Once | OROMUCOSAL | Status: AC
  Administered 2020-10-23: 15 mL via ORAL
  Filled 2020-10-23: qty 15

## 2020-10-23 MED ORDER — OMEPRAZOLE MAGNESIUM 20 MG PO TBEC: 20.0000 mg | DELAYED_RELEASE_TABLET | Freq: Every day | ORAL | 1 refills | Status: AC

## 2020-10-23 MED ORDER — ALUM & MAG HYDROXIDE-SIMETH 200-200-20 MG/5ML PO SUSP
15.0000 mL | Freq: Once | ORAL | Status: AC
  Administered 2020-10-23: 15 mL via ORAL
  Filled 2020-10-23: qty 30

## 2020-10-23 MED ORDER — DOCUSATE SODIUM 100 MG PO CAPS: 100.0000 mg | ORAL_CAPSULE | Freq: Two times a day (BID) | ORAL | 2 refills | Status: AC

## 2020-10-23 NOTE — ED Triage Notes (Signed)
Pto ER with reports of upper abdominal pain. Pt reports constipation. On friday he tried taking laxative- miralax and linzess with some relief, reports taking them multiple times with little results.  Pt also reports having diverticulosis but ate movie theatre popcorn then symptoms worsened.

## 2020-10-23 NOTE — ED Provider Notes (Signed)
[facility] Emergency Department Provider Note   ____________________________________________   Event Date/Time   First MD Initiated Contact with Patient 10/23/20 2137     (approximate)  I have reviewed the triage vital signs and the nursing notes.   HISTORY  Chief Complaint Abdominal Pain    HPI Brandon Nichols is a 60 y.o. male with past medical history of hypertension, hyperlipidemia, asthma, and gout who presents to the ED complaining of abdominal pain.  Patient reports that he has had intermittent pain in the left upper quadrant of his abdomen for about the past week.  Pain is described as sharp and exacerbated when he goes to eat.  He has not had any nausea or vomiting, does report some constipation.  Pain will occasionally move up into his chest but he denies any fevers, cough, or difficulty breathing.  He took Linzess earlier in the week with partial relief, has also tried a laxative without relief.  He is scheduled to see a cardiologist in 2 days for his symptoms.        Past Medical History:  Diagnosis Date  . Asthma   . Gout   . Hyperlipemia   . Hypertension   . Obesity   . Sleep apnea     There are no problems to display for this patient.   Past Surgical History:  Procedure Laterality Date  . APPENDECTOMY    . COLONOSCOPY WITH PROPOFOL N/A 12/25/2014   Procedure: COLONOSCOPY WITH PROPOFOL;  Surgeon: Lollie Sails, MD;  Location: [facility] ENDOSCOPY;  Service: Endoscopy;  Laterality: N/A;    Prior to Admission medications   Medication Sig Start Date End Date Taking? Authorizing Provider  docusate sodium (COLACE) 100 MG capsule Take 1 capsule (100 mg total) by mouth 2 (two) times daily. 10/23/20 01/21/21 Yes Blake Divine, MD  omeprazole (PRILOSEC OTC) 20 MG tablet Take 1 tablet (20 mg total) by mouth daily. 10/23/20 12/18/20 Yes Blake Divine, MD    Allergies Patient has no known allergies.  No family history on file.  Social  History Social History   Tobacco Use  . Smoking status: Never Smoker  Substance Use Topics  . Alcohol use: Yes  . Drug use: No    Review of Systems  Constitutional: No fever/chills Eyes: No visual changes. ENT: No sore throat. Cardiovascular: Positive for chest pain. Respiratory: Denies shortness of breath. Gastrointestinal: Positive for abdominal pain.  No nausea, no vomiting.  No diarrhea.  Positive for constipation. Genitourinary: Negative for dysuria. Musculoskeletal: Negative for back pain. Skin: Negative for rash. Neurological: Negative for headaches, focal weakness or numbness.  ____________________________________________   PHYSICAL EXAM:  VITAL SIGNS: ED Triage Vitals  Enc Vitals Group     BP 10/23/20 1841 (!) 144/99     Pulse Rate 10/23/20 1841 69     Resp 10/23/20 1841 16     Temp 10/23/20 1841 98.2 F (36.8 C)     Temp Source 10/23/20 1841 Oral     SpO2 10/23/20 1841 100 %     Weight 10/23/20 1842 214 lb (97.1 kg)     Height 10/23/20 1842 5\' 11"  (1.803 m)     Head Circumference --      Peak Flow --      Pain Score 10/23/20 1842 8     Pain Loc --      Pain Edu? --      Excl. in [place]? --     Constitutional: Alert and oriented. Eyes:  Conjunctivae are normal. Head: Atraumatic. Nose: No congestion/rhinnorhea. Mouth/Throat: Mucous membranes are moist. Neck: Normal ROM Cardiovascular: Normal rate, regular rhythm. Grossly normal heart sounds. Respiratory: Normal respiratory effort.  No retractions. Lungs CTAB.  No chest wall tenderness to palpation. Gastrointestinal: Soft and tender to palpation in the left upper quadrant with no rebound or guarding. No distention. Genitourinary: deferred Musculoskeletal: No lower extremity tenderness nor edema. Neurologic:  Normal speech and language. No gross focal neurologic deficits are appreciated. Skin:  Skin is warm, dry and intact. No rash noted. Psychiatric: Mood and affect are normal. Speech and behavior are  normal.  ____________________________________________   LABS (all labs ordered are listed, but only abnormal results are displayed)  Labs Reviewed  LIPASE, BLOOD - Abnormal; Notable for the following components:      Result Value   Lipase 62 (*)    All other components within normal limits  COMPREHENSIVE METABOLIC PANEL  CBC  URINALYSIS, COMPLETE (UACMP) WITH MICROSCOPIC  TROPONIN I (HIGH SENSITIVITY)   ____________________________________________  EKG  ED ECG REPORT I, Blake Divine, the attending physician, personally viewed and interpreted this ECG.   Date: 10/23/2020  EKG Time: 18:55  Rate: 80  Rhythm: normal sinus rhythm  Axis: Normal  Intervals:Mildly prolonged QT  ST&T Change: None   PROCEDURES  Procedure(s) performed (including Critical Care):  Procedures   ____________________________________________   INITIAL IMPRESSION / ASSESSMENT AND PLAN / ED COURSE       60 year old male with past medical history of hypertension, hyperlipidemia, asthma, and gout who presents to the ED with intermittent pain in the left upper quadrant of his abdomen worse with eating and occasionally moving up into his chest.  Symptoms sound most consistent with gastritis versus peptic ulcer disease versus GERD.  EKG shows no evidence of arrhythmia or ischemia and troponin is negative, doubt cardiac etiology.  Chest x-ray reviewed by me and shows no infiltrate, edema, or effusion.  Lab work including LFTs and lipase is reassuring, lipase mildly elevated but I doubt pancreatitis.  Patient was given GI cocktail with partial relief in his symptoms.  We will start him on PPI and a stool softener, he was provided with GI follow-up and counseled to return to the ED for new or worsening symptoms.  Patient agrees with plan.      ____________________________________________   FINAL CLINICAL IMPRESSION(S) / ED DIAGNOSES  Final diagnoses:  LUQ pain  Acute gastritis without hemorrhage,  unspecified gastritis type     ED Discharge Orders         Ordered    docusate sodium (COLACE) 100 MG capsule  2 times daily        10/23/20 2321    omeprazole (PRILOSEC OTC) 20 MG tablet  Daily        10/23/20 2321           Note:  This document was prepared using Dragon voice recognition software and may include unintentional dictation errors.   Blake Divine, MD 10/23/20 [phone]

## 2020-10-25 ENCOUNTER — Ambulatory Visit
Admission: RE | Admit: 2020-10-25 | Discharge: 2020-10-25 | Disposition: A | Discharge status: Home / Self Care | Payer: 59 | Source: Ambulatory Visit | Attending: Family Medicine | Admitting: Family Medicine

## 2020-10-25 ENCOUNTER — Other Ambulatory Visit: Payer: Self-pay

## 2020-10-25 ENCOUNTER — Other Ambulatory Visit: Payer: Self-pay | Admitting: Family Medicine

## 2020-10-25 DIAGNOSIS — R1032 Left lower quadrant pain: Secondary | ICD-10-CM | POA: Insufficient documentation

## 2020-10-25 DIAGNOSIS — K5900 Constipation, unspecified: Secondary | ICD-10-CM | POA: Insufficient documentation

## 2020-10-25 MED ORDER — IOHEXOL 300 MG/ML  SOLN
100.0000 mL | Freq: Once | INTRAMUSCULAR | Status: AC | PRN
  Administered 2020-10-25: 100 mL via INTRAVENOUS

## 2020-10-26 ENCOUNTER — Other Ambulatory Visit: Payer: Self-pay | Admitting: Family Medicine

## 2020-10-26 DIAGNOSIS — K5900 Constipation, unspecified: Secondary | ICD-10-CM

## 2020-10-26 DIAGNOSIS — R1032 Left lower quadrant pain: Secondary | ICD-10-CM

## 2020-10-27 ENCOUNTER — Emergency Department
Admission: EM | Admit: 2020-10-27 | Discharge: 2020-10-27 | Disposition: A | Discharge status: Home / Self Care | Payer: 59 | Attending: Emergency Medicine | Admitting: Emergency Medicine

## 2020-10-27 ENCOUNTER — Other Ambulatory Visit: Payer: Self-pay

## 2020-10-27 ENCOUNTER — Emergency Department: Payer: 59

## 2020-10-27 DIAGNOSIS — I10 Essential (primary) hypertension: Secondary | ICD-10-CM | POA: Insufficient documentation

## 2020-10-27 DIAGNOSIS — J45909 Unspecified asthma, uncomplicated: Secondary | ICD-10-CM | POA: Diagnosis not present

## 2020-10-27 DIAGNOSIS — Z79899 Other long term (current) drug therapy: Secondary | ICD-10-CM | POA: Insufficient documentation

## 2020-10-27 DIAGNOSIS — K8500 Idiopathic acute pancreatitis without necrosis or infection: Secondary | ICD-10-CM | POA: Diagnosis not present

## 2020-10-27 DIAGNOSIS — R1013 Epigastric pain: Secondary | ICD-10-CM | POA: Diagnosis present

## 2020-10-27 LAB — URINALYSIS, COMPLETE (UACMP) WITH MICROSCOPIC
Bacteria, UA: NONE SEEN
Bilirubin Urine: NEGATIVE
Glucose, UA: NEGATIVE mg/dL
Ketones, ur: 20 mg/dL — AB
Leukocytes,Ua: NEGATIVE
Nitrite: NEGATIVE
Protein, ur: NEGATIVE mg/dL
Specific Gravity, Urine: 1.029 (ref 1.005–1.030)
pH: 5 (ref 5.0–8.0)

## 2020-10-27 LAB — CBC
HCT: 42.4 % (ref 39.0–52.0)
Hemoglobin: 15 g/dL (ref 13.0–17.0)
MCH: 31.4 pg (ref 26.0–34.0)
MCHC: 35.4 g/dL (ref 30.0–36.0)
MCV: 88.7 fL (ref 80.0–100.0)
Platelets: 201 10*3/uL (ref 150–400)
RBC: 4.78 MIL/uL (ref 4.22–5.81)
RDW: 11.9 % (ref 11.5–15.5)
WBC: 4.4 10*3/uL (ref 4.0–10.5)
nRBC: 0 % (ref 0.0–0.2)

## 2020-10-27 LAB — COMPREHENSIVE METABOLIC PANEL
ALT: 22 U/L (ref 0–44)
AST: 21 U/L (ref 15–41)
Albumin: 4.2 g/dL (ref 3.5–5.0)
Alkaline Phosphatase: 51 U/L (ref 38–126)
Anion gap: 10 (ref 5–15)
BUN: 20 mg/dL (ref 8–23)
CO2: 24 mmol/L (ref 22–32)
Calcium: 9.5 mg/dL (ref 8.9–10.3)
Chloride: 101 mmol/L (ref 98–111)
Creatinine, Ser: 1 mg/dL (ref 0.61–1.24)
GFR, Estimated: >60 mL/min (ref >60)
Glucose, Bld: 93 mg/dL (ref 70–99)
Potassium: 4 mmol/L (ref 3.5–5.1)
Sodium: 135 mmol/L (ref 135–145)
Total Bilirubin: 1.3 mg/dL — ABNORMAL HIGH (ref 0.3–1.2)
Total Protein: 8.1 g/dL (ref 6.5–8.1)

## 2020-10-27 LAB — LIPASE, BLOOD: Lipase: 45 U/L (ref 11–51)

## 2020-10-27 MED ORDER — SODIUM CHLORIDE 0.9 % IV BOLUS
1000.0000 mL | Freq: Once | INTRAVENOUS | Status: AC
  Administered 2020-10-27: 1000 mL via INTRAVENOUS

## 2020-10-27 MED ORDER — MORPHINE SULFATE (PF) 4 MG/ML IV SOLN
4.0000 mg | Freq: Once | INTRAVENOUS | Status: AC
Start: 2020-10-27 — End: 2020-10-27
  Administered 2020-10-27: 4 mg via INTRAVENOUS
  Filled 2020-10-27: qty 1

## 2020-10-27 MED ORDER — IOHEXOL 300 MG/ML  SOLN
100.0000 mL | Freq: Once | INTRAMUSCULAR | Status: AC | PRN
  Administered 2020-10-27: 100 mL via INTRAVENOUS

## 2020-10-27 MED ORDER — ONDANSETRON HCL 4 MG/2ML IJ SOLN
4.0000 mg | Freq: Once | INTRAMUSCULAR | Status: AC
  Administered 2020-10-27: 4 mg via INTRAVENOUS
  Filled 2020-10-27: qty 2

## 2020-10-27 MED ORDER — HYDROCODONE-ACETAMINOPHEN 5-325 MG PO TABS: 1.0000 | ORAL_TABLET | Freq: Four times a day (QID) | ORAL | 0 refills | Status: AC | PRN

## 2020-10-27 NOTE — Discharge Instructions (Signed)
Clear liquids for 24 hours, then soft foods for 24 hours, then regular diet Vicodin for pain as needed Return if worsening

## 2020-10-27 NOTE — ED Notes (Signed)
Pt presents to ED with c/o of being sent from [facility] clinic for IV ABX and IVF. Pt states pain started a few days ago in the LUQ. Pt states he drinks alcohol occcasionally but denies any daily alcohol use. Pt denies urinary symptoms at this time. Pt denies fever or chills at this time. NAD noted.

## 2020-10-27 NOTE — ED Triage Notes (Signed)
Pt sent from [facility] for abx and fluids for pancreatitis. Pt c/o upper abd pain for the past 10 days, states he had a CT 5/2 and has been just drinking fluids and not eating any food. Denies N/V/D.Brandon Nichols

## 2020-10-27 NOTE — ED Provider Notes (Signed)
[facility] Emergency Department Provider Note  ____________________________________________   Event Date/Time   First MD Initiated Contact with Patient 10/27/20 1215     (approximate)  I have reviewed the triage vital signs and the nursing notes.   HISTORY  Chief Complaint Abdominal Pain    HPI Brandon Nichols is a 60 y.o. male presents emergency department complaining of epigastric pain for 10 days.  Patient states he was seen by his doctor at [facility] and had a CT which showed pancreatitis.  States he has been getting worse over the last couple of days.  States they gave him a Toradol shot at [facility] yesterday which did help but the pain returned.  He has been unable to eat and drink due to the pain in his abdomen.  He denies any fever or chills.  He was told to come to the ED for fluids and antibiotics.   Patient has history of hyperlipidemia, hypertension, and gout.  He has not been taking his blood pressure medicine for the last few days   Past Medical History:  Diagnosis Date  . Asthma   . Gout   . Hyperlipemia   . Hypertension   . Obesity   . Sleep apnea     There are no problems to display for this patient.   Past Surgical History:  Procedure Laterality Date  . APPENDECTOMY    . COLONOSCOPY WITH PROPOFOL N/A 12/25/2014   Procedure: COLONOSCOPY WITH PROPOFOL;  Surgeon: Lollie Sails, MD;  Location: [facility] ENDOSCOPY;  Service: Endoscopy;  Laterality: N/A;    Prior to Admission medications   Medication Sig Start Date End Date Taking? Authorizing Provider  allopurinol (ZYLOPRIM) 100 MG tablet Take 100 mg by mouth daily. 10/19/20  Yes [provider]  amLODipine (NORVASC) 2.5 MG tablet Take 2.5 mg by mouth daily. 09/17/20  Yes [provider]  docusate sodium (COLACE) 100 MG capsule Take 1 capsule (100 mg total) by mouth 2 (two) times daily. 10/23/20 01/21/21 Yes Blake Divine, MD  escitalopram (LEXAPRO) 10  MG tablet Take 1 tablet by mouth daily. 10/21/20  Yes [provider]  HYDROcodone-acetaminophen (NORCO/VICODIN) 5-325 MG tablet Take 1 tablet by mouth every 6 (six) hours as needed for moderate pain. 10/27/20  Yes Versie Starks, PA-C  LINZESS 145 MCG CAPS capsule Take 145 mcg by mouth daily. 10/19/20  Yes [provider]  omeprazole (PRILOSEC OTC) 20 MG tablet Take 1 tablet (20 mg total) by mouth daily. 10/23/20 12/18/20 Yes Blake Divine, MD  ondansetron (ZOFRAN-ODT) 4 MG disintegrating tablet Take 4 mg by mouth every 8 (eight) hours as needed. 10/26/20  Yes [provider]    Allergies Patient has no known allergies.  No family history on file.  Social History Social History   Tobacco Use  . Smoking status: Never Smoker  . Smokeless tobacco: Never Used  Substance Use Topics  . Alcohol use: Yes  . Drug use: No    Review of Systems  Constitutional: No fever/chills Eyes: No visual changes. ENT: No sore throat. Respiratory: Denies cough Cardiovascular: Denies chest pain Gastrointestinal: Positive abdominal pain Genitourinary: Negative for dysuria. Musculoskeletal: Negative for back pain. Skin: Negative for rash. Psychiatric: no mood changes,     ____________________________________________   PHYSICAL EXAM:  VITAL SIGNS: ED Triage Vitals  Enc Vitals Group     BP 10/27/20 1107 (!) 139/98     Pulse Rate 10/27/20 1107 62     Resp 10/27/20  1107 18     Temp 10/27/20 1107 97.9 F (36.6 C)     Temp Source 10/27/20 1107 Oral     SpO2 10/27/20 1107 98 %     Weight 10/27/20 1108 205 lb (93 kg)     Height 10/27/20 1108 5\' 11"  (1.803 m)     Head Circumference --      Peak Flow --      Pain Score 10/27/20 1107 7     Pain Loc --      Pain Edu? --      Excl. in [place]? --     Constitutional: Alert and oriented. Well appearing and in no acute distress. Eyes: Conjunctivae are normal.  Head: Atraumatic. Nose: No congestion/rhinnorhea. Mouth/Throat:  Mucous membranes are moist.   Neck:  supple no lymphadenopathy noted Cardiovascular: Normal rate, regular rhythm. Heart sounds are normal Respiratory: Normal respiratory effort.  No retractions, lungs c t a  Abd: soft tender in the epigastric area, bs normal all 4 quad GU: deferred Musculoskeletal: FROM all extremities, warm and well perfused Neurologic:  Normal speech and language.  Skin:  Skin is warm, dry and intact. No rash noted. Psychiatric: Mood and affect are normal. Speech and behavior are normal.  ____________________________________________   LABS (all labs ordered are listed, but only abnormal results are displayed)  Labs Reviewed  COMPREHENSIVE METABOLIC PANEL - Abnormal; Notable for the following components:      Result Value   Total Bilirubin 1.3 (*)    All other components within normal limits  URINALYSIS, COMPLETE (UACMP) WITH MICROSCOPIC - Abnormal; Notable for the following components:   Color, Urine YELLOW (*)    APPearance HAZY (*)    Hgb urine dipstick SMALL (*)    Ketones, ur 20 (*)    All other components within normal limits  LIPASE, BLOOD  CBC   ____________________________________________   ____________________________________________  RADIOLOGY  CT abdomen/pelvis  ____________________________________________   PROCEDURES  Procedure(s) performed: No  Procedures    ____________________________________________   INITIAL IMPRESSION / ASSESSMENT AND PLAN / ED COURSE  Pertinent labs & imaging results that were available during my care of the patient were reviewed by me and considered in my medical decision making (see chart for details).   Patient is a 60 year old male presents with epigastric pain.  See HPI.  Symptoms been ongoing for 10 days.  DDx: Acute pancreatitis, chronic pancreatitis, PUD, kidney stone, acute cholecystitis  Labs are reassuring, CBC, metabolic panel, lipase, and urinalysis are normal   CT abdomen/pelvis IV  contrast  Patient was given normal saline 1 L IV, morphine 4 mg IV and Zofran 4 mg IV  CT abdomen/pelvis IV contrast reviewed by me confirmed by radiology does not show any acute changes from the scan 2 days prior.  No sign of abscess or infection.  No necrosis.  I did explain findings to the patient.  States he is feeling much better after the fluids and Zofran.  Explained to him that for this type of pancreatitis he may need to use fluids, stay on clear liquids for 24 hours,  soft foods during the next 24 hours, and then he can return to his normal diet as tolerated.  He was given pain medication.  Instructed follow-up with his regular doctor.  Return emergency department if worsening.  Discharged in stable condition.  Brandon Nichols was evaluated in Emergency Department on 10/27/2020 for the symptoms described in the history of present illness. He was evaluated in the  context of the global COVID-19 pandemic, which necessitated consideration that the patient might be at risk for infection with the SARS-CoV-2 virus that causes COVID-19. Institutional protocols and algorithms that pertain to the evaluation of patients at risk for COVID-19 are in a state of rapid change based on information released by regulatory bodies including the CDC and federal and state organizations. These policies and algorithms were followed during the patient's care in the ED.    As part of my medical decision making, I reviewed the following data within the [facility] notes reviewed and incorporated, Labs reviewed , Old chart reviewed, Radiograph reviewed , Notes from prior ED visits and [place] Controlled Substance Database  ____________________________________________   FINAL CLINICAL IMPRESSION(S) / ED DIAGNOSES  Final diagnoses:  Idiopathic acute pancreatitis without infection or necrosis      NEW MEDICATIONS STARTED DURING THIS VISIT:  New Prescriptions   HYDROCODONE-ACETAMINOPHEN  (NORCO/VICODIN) 5-325 MG TABLET    Take 1 tablet by mouth every 6 (six) hours as needed for moderate pain.     Note:  This document was prepared using Dragon voice recognition software and may include unintentional dictation errors.    Versie Starks, PA-C 10/27/20 1713    Vanessa [place], MD 10/28/20 1003

## 2022-08-02 ENCOUNTER — Other Ambulatory Visit: Payer: Self-pay | Admitting: Internal Medicine

## 2022-08-02 DIAGNOSIS — R0789 Other chest pain: Secondary | ICD-10-CM

## 2022-08-21 ENCOUNTER — Telehealth (HOSPITAL_COMMUNITY): Payer: Self-pay | Admitting: *Deleted

## 2022-08-21 MED ORDER — METOPROLOL TARTRATE 100 MG PO TABS: ORAL_TABLET | ORAL | 0 refills | Status: AC

## 2022-08-21 NOTE — Telephone Encounter (Signed)
Reaching out to patient to offer assistance regarding upcoming cardiac imaging study; pt verbalizes understanding of appt date/time, parking situation and where to check in, pre-test NPO status and medications ordered, and verified current allergies; name and call back number provided for further questions should they arise  Gordy Clement RN Navigator Cardiac Imaging [facility] [phone] office [phone] cell  Patient to take '100mg'$  two hours prior to his cardiac CT scan.

## 2022-08-24 ENCOUNTER — Ambulatory Visit
Admission: RE | Admit: 2022-08-24 | Discharge: 2022-08-24 | Disposition: A | Discharge status: Home / Self Care | Payer: BC Managed Care – PPO | Source: Ambulatory Visit | Attending: Internal Medicine | Admitting: Internal Medicine

## 2022-08-24 DIAGNOSIS — R0789 Other chest pain: Secondary | ICD-10-CM | POA: Diagnosis present

## 2022-08-24 MED ORDER — IOHEXOL 350 MG/ML SOLN
100.0000 mL | Freq: Once | INTRAVENOUS | Status: AC | PRN
  Administered 2022-08-24: 100 mL via INTRAVENOUS

## 2022-08-24 MED ORDER — NITROGLYCERIN 0.4 MG SL SUBL
0.8000 mg | SUBLINGUAL_TABLET | Freq: Once | SUBLINGUAL | Status: AC
  Administered 2022-08-24: 0.8 mg via SUBLINGUAL

## 2022-08-24 NOTE — Progress Notes (Signed)
Patient tolerated procedure well. Ambulate w/o difficulty. Denies light headedness or being dizzy. Sitting in chair drinking water provided. Encouraged to drink extra water today and reasoning explained. Verbalized understanding. Feels tired. All questions answered. ABC intact. No further needs. Discharge from procedure area w/o issues.

## 2023-08-23 ENCOUNTER — Other Ambulatory Visit: Payer: Self-pay | Admitting: Internal Medicine

## 2023-08-23 DIAGNOSIS — R109 Unspecified abdominal pain: Secondary | ICD-10-CM

## 2023-08-23 DIAGNOSIS — R140 Abdominal distension (gaseous): Secondary | ICD-10-CM

## 2023-08-24 ENCOUNTER — Ambulatory Visit
Admission: RE | Admit: 2023-08-24 | Discharge: 2023-08-24 | Disposition: A | Discharge status: Home / Self Care | Payer: BC Managed Care – PPO | Source: Ambulatory Visit | Attending: Internal Medicine | Admitting: Internal Medicine

## 2023-08-24 DIAGNOSIS — R109 Unspecified abdominal pain: Secondary | ICD-10-CM | POA: Insufficient documentation

## 2023-08-24 DIAGNOSIS — R140 Abdominal distension (gaseous): Secondary | ICD-10-CM | POA: Diagnosis present

## 2023-11-20 ENCOUNTER — Other Ambulatory Visit: Payer: Self-pay

## 2023-11-20 ENCOUNTER — Emergency Department
Admission: EM | Admit: 2023-11-20 | Discharge: 2023-11-20 | Disposition: A | Discharge status: Home / Self Care | Attending: Emergency Medicine | Admitting: Emergency Medicine

## 2023-11-20 ENCOUNTER — Emergency Department

## 2023-11-20 DIAGNOSIS — K852 Alcohol induced acute pancreatitis without necrosis or infection: Secondary | ICD-10-CM | POA: Diagnosis not present

## 2023-11-20 DIAGNOSIS — R1010 Upper abdominal pain, unspecified: Secondary | ICD-10-CM | POA: Diagnosis present

## 2023-11-20 DIAGNOSIS — I10 Essential (primary) hypertension: Secondary | ICD-10-CM | POA: Insufficient documentation

## 2023-11-20 MED ORDER — IOHEXOL 300 MG/ML  SOLN
100.0000 mL | Freq: Once | INTRAMUSCULAR | Status: AC | PRN
  Administered 2023-11-20: 100 mL via INTRAVENOUS

## 2023-11-20 MED ORDER — OXYCODONE-ACETAMINOPHEN 5-325 MG PO TABS: 1.0000 | ORAL_TABLET | ORAL | 0 refills | Status: AC | PRN

## 2023-11-20 NOTE — ED Triage Notes (Signed)
 Pt states that he has been having upper abd pain for 2 weeks states that he just left fron Dr Arman Lamprey office where they drew lab work and was sent to be evaluated with a ct scan

## 2023-11-20 NOTE — ED Provider Notes (Signed)
 [facility] Provider Note    Event Date/Time   First MD Initiated Contact with Patient 11/20/23 1508     (approximate)   History   Abdominal Pain   HPI  Brandon Nichols is a 63 y.o. male with a history of hypertension, hyperlipidemia,, gout, diverticulitis who presents with upper abdominal pain for approximately last 1 to 2 weeks, intermittent course, worse when he tries to eat and at night.  It is associated with nausea but no vomiting.  He reports a decreased appetite.  He says that almost any solid food exacerbates the pain.  He has been somewhat constipated and has not had any diarrhea.  He reports that he typically drinks about 3-4 beers per week although did have some liquor with a friend about a week and a half ago before this started.  He states he was prescribed pantoprazole by his doctor which has helped only minimally.  I reviewed the past medical records; prior to today, the patient's most recent outpatient visit was with Dr. Beau Bound from cardiology on 4/7 for follow-up of his chronic conditions.   Physical Exam   Triage Vital Signs: ED Triage Vitals  Encounter Vitals Group     BP 11/20/23 1422 (!) 144/94     Systolic BP Percentile --      Diastolic BP Percentile --      Pulse Rate 11/20/23 1422 64     Resp 11/20/23 1422 16     Temp 11/20/23 1422 97.8 F (36.6 C)     Temp Source 11/20/23 1422 Oral     SpO2 11/20/23 1422 99 %     Weight 11/20/23 1423 215 lb (97.5 kg)     Height 11/20/23 1423 5\' 11"  (1.803 m)     Head Circumference --      Peak Flow --      Pain Score 11/20/23 1423 5     Pain Loc --      Pain Education --      Exclude from Growth Chart --     Most recent vital signs: Vitals:   11/20/23 1829 11/20/23 1839  BP: (!) 133/90   Pulse: 71   Resp: 17   Temp:  97.9 F (36.6 C)  SpO2: 98%      General: Awake, no distress.  CV:  Good peripheral perfusion.  Resp:  Normal effort.  Abd:  Soft with mild epigastric  discomfort.  No distention.  Other:  No jaundice or scleral icterus.   ED Results / Procedures / Treatments   Labs (all labs ordered are listed, but only abnormal results are displayed) Labs Reviewed - No data to display   EKG     RADIOLOGY  CT abdomen/pelvis: I independently viewed and interpreted the images; there are no dilated valves or any free air or free fluid.  Radiology report indicates the following:  IMPRESSION:  1. Findings concerning for acute pancreatitis. Correlation with  pancreatic enzymes recommended. No fluid collection.  2. No bowel obstruction.  3.  Aortic Atherosclerosis (ICD10-I70.0).    PROCEDURES:  Critical Care performed: No  Procedures   MEDICATIONS ORDERED IN ED: Medications  iohexol  (OMNIPAQUE ) 300 MG/ML solution 100 mL (100 mLs Intravenous Contrast Given 11/20/23 1706)     IMPRESSION / MDM / ASSESSMENT AND PLAN / ED COURSE  I reviewed the triage vital signs and the nursing notes.  63 year old male with PMH as noted above presents with epigastric abdominal pain for approximately last 1 to  2 weeks associated with nausea and decreased appetite.  On exam the patient is overall well-appearing.  His vital signs are normal except for hypertension.  Abdomen soft with minimal epigastric discomfort.  The patient was sent by his PMD Dr. Jannell Memo.  Labs obtained earlier today show a lipase of 574 consistent with acute pancreatitis.  LFTs are minimally elevated.  Bilirubin is normal.  Differential diagnosis includes, but is not limited to, acute pancreatitis, gastritis.  Pancreatitis seems most likely alcohol related.  Given the labs, there is no evidence of biliary obstruction.  It we will obtain a CT for further evaluation.  Patient's presentation is most consistent with acute presentation with potential threat to life or bodily function.  The patient is on the cardiac monitor to evaluate for evidence of arrhythmia and/or significant heart rate  changes.  ----------------------------------------- 6:41 PM on 11/20/2023 -----------------------------------------  CT shows findings consistent with uncomplicated pancreatitis.  On reassessment, the patient continues to appear well.  He has not required any pain medication while in the ED.  I strongly considered whether the patient may benefit from inpatient admission given his age with acute pancreatitis.  However, he is overall low risk, and he would strongly prefer to go home.  Overall seems that the inciting event was alcohol related.  He is no longer drinking.  He has been tolerating p.o. at home.  There is no evidence of gallstone pancreatitis.  He is not having severe pain at this time.  He has no lab findings to suggest end organ dysfunction, systemic inflammation or sepsis, or other acute complications.  His vital signs are normal.  His symptoms have also already been going on for a week.  Therefore, he is appropriate for outpatient management.  I counseled the patient extensively on the results of the workup and plan of care, includes dietary restrictions.  I recommended that he consume only clears for the next 1 to 2 days and advance to a low-fat diet.  I have prescribed pain medication.  I instructed him to follow-up with his primary care doctor next week.  I gave strict return precautions, he expressed understanding.   FINAL CLINICAL IMPRESSION(S) / ED DIAGNOSES   Final diagnoses:  Alcohol-induced acute pancreatitis without infection or necrosis     Rx / DC Orders   ED Discharge Orders          Ordered    oxyCODONE-acetaminophen  (PERCOCET) 5-325 MG tablet  Every 4 hours PRN        11/20/23 1840             Note:  This document was prepared using Dragon voice recognition software and may include unintentional dictation errors.    Lind Repine, MD 11/20/23 [phone]

## 2023-11-20 NOTE — Discharge Instructions (Addendum)
 You have pancreatitis, which is inflammation in the pancreas.  For the next 1 to 2 days you should consume only clear liquids such as broth and avoid eating solid food.  After this, you can advance to a bland, low-fat diet as per the instructions provided.  Avoid any alcohol.  You may take the pain medication as needed.  Make an appointment to follow-up with your primary care doctor in about a week to have your blood work rechecked.  Pancreatitis can be potentially very dangerous.  Return to the ER immediately for new, worsening, or persistent severe pain, vomiting, fever, or any other new or worsening symptoms that concern you.
